# Patient Record
Sex: Female | Born: 1960 | Race: White | Hispanic: No | Marital: Married | Smoking: Never smoker
Health system: Southern US, Community
[De-identification: ages and names within clinical notes are randomized; demographics above are authoritative.]

---

## 1998-09-19 ENCOUNTER — Ambulatory Visit (HOSPITAL_COMMUNITY): Admission: RE | Admit: 1998-09-19 | Discharge: 1998-09-19 | Payer: Self-pay | Admitting: Obstetrics and Gynecology

## 2004-11-04 ENCOUNTER — Other Ambulatory Visit: Admission: RE | Admit: 2004-11-04 | Discharge: 2004-11-04 | Payer: Self-pay | Admitting: Obstetrics & Gynecology

## 2009-02-25 ENCOUNTER — Encounter: Admission: RE | Admit: 2009-02-25 | Discharge: 2009-02-25 | Payer: Self-pay | Admitting: Obstetrics & Gynecology

## 2010-03-12 ENCOUNTER — Encounter: Admission: RE | Admit: 2010-03-12 | Discharge: 2010-03-12 | Payer: Self-pay | Admitting: Family Medicine

## 2010-06-12 HISTORY — PX: BACK SURGERY: SHX140

## 2010-06-26 ENCOUNTER — Inpatient Hospital Stay (HOSPITAL_COMMUNITY): Admission: RE | Admit: 2010-06-26 | Discharge: 2010-06-28 | Payer: Self-pay | Admitting: Orthopedic Surgery

## 2011-02-27 LAB — TYPE AND SCREEN: ABO/RH(D): A POS

## 2011-02-28 LAB — CBC
MCH: 32.4 pg (ref 26.0–34.0)
RBC: 4.25 MIL/uL (ref 3.87–5.11)
WBC: 7.9 10*3/uL (ref 4.0–10.5)

## 2011-02-28 LAB — COMPREHENSIVE METABOLIC PANEL
Albumin: 4 g/dL (ref 3.5–5.2)
CO2: 24 mEq/L (ref 19–32)
Calcium: 9.1 mg/dL (ref 8.4–10.5)
Creatinine, Ser: 0.61 mg/dL (ref 0.4–1.2)
GFR calc non Af Amer: >60 mL/min (ref >60)
Glucose, Bld: 123 mg/dL — ABNORMAL HIGH (ref 70–99)
Potassium: 3.6 mEq/L (ref 3.5–5.1)
Sodium: 139 mEq/L (ref 135–145)
Total Protein: 7 g/dL (ref 6.0–8.3)

## 2011-02-28 LAB — DIFFERENTIAL
Basophils Absolute: 0 10*3/uL (ref 0.0–0.1)
Basophils Relative: 1 % (ref 0–1)
Eosinophils Absolute: 0.3 10*3/uL (ref 0.0–0.7)
Eosinophils Relative: 4 % (ref 0–5)
Lymphocytes Relative: 36 % (ref 12–46)

## 2011-02-28 LAB — URINALYSIS, ROUTINE W REFLEX MICROSCOPIC
Bilirubin Urine: NEGATIVE
Glucose, UA: NEGATIVE mg/dL
Ketones, ur: NEGATIVE mg/dL
Nitrite: NEGATIVE
Specific Gravity, Urine: 1.031 — ABNORMAL HIGH (ref 1.005–1.030)
Urobilinogen, UA: 0.2 mg/dL (ref 0.0–1.0)
pH: 5.5 (ref 5.0–8.0)

## 2011-02-28 LAB — PROTIME-INR: Prothrombin Time: 13.3 seconds (ref 11.6–15.2)

## 2011-02-28 LAB — APTT: aPTT: 26 seconds (ref 24–37)

## 2011-09-17 ENCOUNTER — Other Ambulatory Visit: Payer: Self-pay | Admitting: Orthopedic Surgery

## 2011-09-17 DIAGNOSIS — M5126 Other intervertebral disc displacement, lumbar region: Secondary | ICD-10-CM

## 2011-09-21 ENCOUNTER — Ambulatory Visit
Admission: RE | Admit: 2011-09-21 | Discharge: 2011-09-21 | Disposition: A | Discharge status: Home / Self Care | Payer: BC Managed Care – PPO | Source: Ambulatory Visit | Attending: Orthopedic Surgery | Admitting: Orthopedic Surgery

## 2011-09-21 DIAGNOSIS — M5126 Other intervertebral disc displacement, lumbar region: Secondary | ICD-10-CM

## 2011-09-21 MED ORDER — DIAZEPAM 5 MG PO TABS
10.0000 mg | ORAL_TABLET | Freq: Once | ORAL | Status: AC
  Administered 2011-09-21: 10 mg via ORAL

## 2011-09-21 MED ORDER — IOHEXOL 180 MG/ML  SOLN
15.0000 mL | Freq: Once | INTRAMUSCULAR | Status: AC | PRN
  Administered 2011-09-21: 15 mL via INTRATHECAL

## 2012-10-19 ENCOUNTER — Encounter (HOSPITAL_COMMUNITY): Payer: Self-pay

## 2012-10-23 ENCOUNTER — Encounter (HOSPITAL_COMMUNITY)
Admission: RE | Admit: 2012-10-23 | Discharge: 2012-10-23 | Disposition: A | Discharge status: Home / Self Care | Payer: BC Managed Care – PPO | Source: Ambulatory Visit | Attending: Orthopedic Surgery | Admitting: Orthopedic Surgery

## 2012-10-23 ENCOUNTER — Ambulatory Visit (HOSPITAL_COMMUNITY)
Admission: RE | Admit: 2012-10-23 | Discharge: 2012-10-23 | Disposition: A | Discharge status: Home / Self Care | Payer: BC Managed Care – PPO | Source: Ambulatory Visit | Attending: Surgical | Admitting: Surgical

## 2012-10-23 ENCOUNTER — Encounter (HOSPITAL_COMMUNITY): Payer: Self-pay

## 2012-10-23 DIAGNOSIS — Z01818 Encounter for other preprocedural examination: Secondary | ICD-10-CM | POA: Insufficient documentation

## 2012-10-23 DIAGNOSIS — Z01810 Encounter for preprocedural cardiovascular examination: Secondary | ICD-10-CM | POA: Insufficient documentation

## 2012-10-23 DIAGNOSIS — M4120 Other idiopathic scoliosis, site unspecified: Secondary | ICD-10-CM | POA: Insufficient documentation

## 2012-10-23 DIAGNOSIS — Z882 Allergy status to sulfonamides status: Secondary | ICD-10-CM | POA: Insufficient documentation

## 2012-10-23 DIAGNOSIS — Z01812 Encounter for preprocedural laboratory examination: Secondary | ICD-10-CM | POA: Insufficient documentation

## 2012-10-23 LAB — COMPREHENSIVE METABOLIC PANEL
ALT: 32 U/L (ref 0–35)
AST: 31 U/L (ref 0–37)
Albumin: 4.1 g/dL (ref 3.5–5.2)
Alkaline Phosphatase: 53 U/L (ref 39–117)
BUN: 7 mg/dL (ref 6–23)
CO2: 28 mEq/L (ref 19–32)
Calcium: 9.7 mg/dL (ref 8.4–10.5)
Chloride: 104 mEq/L (ref 96–112)
Creatinine, Ser: 0.59 mg/dL (ref 0.50–1.10)
GFR calc Af Amer: >90 mL/min (ref >90)
GFR calc non Af Amer: >90 mL/min (ref >90)
Glucose, Bld: 106 mg/dL — ABNORMAL HIGH (ref 70–99)
Potassium: 3.8 mEq/L (ref 3.5–5.1)
Sodium: 140 mEq/L (ref 135–145)
Total Bilirubin: 0.3 mg/dL (ref 0.3–1.2)
Total Protein: 7.5 g/dL (ref 6.0–8.3)

## 2012-10-23 LAB — CBC
MCH: 30.8 pg (ref 26.0–34.0)
MCHC: 34.1 g/dL (ref 30.0–36.0)
Platelets: 277 10*3/uL (ref 150–400)
RDW: 12.6 % (ref 11.5–15.5)

## 2012-10-23 LAB — URINALYSIS, ROUTINE W REFLEX MICROSCOPIC
Bilirubin Urine: NEGATIVE
Glucose, UA: NEGATIVE mg/dL
Ketones, ur: NEGATIVE mg/dL
Leukocytes, UA: NEGATIVE
Nitrite: NEGATIVE
Protein, ur: NEGATIVE mg/dL
Specific Gravity, Urine: 1.011 (ref 1.005–1.030)
Urobilinogen, UA: 0.2 mg/dL (ref 0.0–1.0)
pH: 6.5 (ref 5.0–8.0)

## 2012-10-23 LAB — APTT: aPTT: 26 seconds (ref 24–37)

## 2012-10-23 LAB — URINE MICROSCOPIC-ADD ON

## 2012-10-23 LAB — PROTIME-INR
INR: 0.91 (ref 0.00–1.49)
Prothrombin Time: 12.2 seconds (ref 11.6–15.2)

## 2012-10-23 LAB — SURGICAL PCR SCREEN: Staphylococcus aureus: NEGATIVE

## 2012-10-23 NOTE — Patient Instructions (Signed)
20      Your procedure is scheduled on:  Thursday 10/26/2012 at 0730am  Report to [facility] at 0530 AM.  Call this number if you have problems the morning of surgery: [phone]   Remember:   Do not eat food or drink liquids after midnight!  Take these medicines the morning of surgery with A SIP OF WATER: none   Do not bring valuables to the hospital.  .  Leave suitcase in the car. After surgery it may be brought to your room.  For patients admitted to the hospital, checkout time is 11:00 AM the day of              Discharge.    Special Instructions: See [facility] Preparing  For Surgery Instruction Sheet. Do not wear jewelry, lotions powders, perfumes. Women do not shave  legs or underarms for 12 hours before showers. Contacts, partial plates, or dentures may not be worn into surgery.                          Patients discharged the day of surgery will not be allowed to drive home.  If going home the same day of surgery, must have someone stay with you  first 24 hrs.at home and arrange for someone to drive you home from the Hospital.             YOUR DRIVER IS: Spouse- Loraine Leriche   Please read over the following fact sheets that you were given: MRSA INFORMATION,INCENTIVE SPIROMETRY SHEET, SLEEP APNEA SHEET                            Telford Nab.Emera Bussie,RN,BSN     [phone]

## 2012-10-25 NOTE — Anesthesia Preprocedure Evaluation (Addendum)
Anesthesia Evaluation  Patient identified by MRN, date of birth, ID band Patient awake    Reviewed: Allergy & Precautions, H&P , NPO status , Patient's Chart, lab work & pertinent test results  History of Anesthesia Complications (+) AWARENESS UNDER ANESTHESIA  Airway Mallampati: II TM Distance: >3 FB Neck ROM: Full    Dental  (+) Teeth Intact and Dental Advisory Given   Pulmonary neg pulmonary ROS,  breath sounds clear to auscultation  Pulmonary exam normal       Cardiovascular negative cardio ROS  Rhythm:Regular Rate:Normal     Neuro/Psych negative neurological ROS  negative psych ROS   GI/Hepatic negative GI ROS, Neg liver ROS,   Endo/Other  negative endocrine ROS  Renal/GU negative Renal ROS  negative genitourinary   Musculoskeletal negative musculoskeletal ROS (+)   Abdominal   Peds  Hematology negative hematology ROS (+)   Anesthesia Other Findings   Reproductive/Obstetrics negative OB ROS                          Anesthesia Physical Anesthesia Plan  ASA: I  Anesthesia Plan: General   Post-op Pain Management:    Induction: Intravenous  Airway Management Planned: Oral ETT  Additional Equipment:   Intra-op Plan:   Post-operative Plan: Extubation in OR  Informed Consent: I have reviewed the patients History and Physical, chart, labs and discussed the procedure including the risks, benefits and alternatives for the proposed anesthesia with the patient or authorized representative who has indicated his/her understanding and acceptance.   Dental advisory given  Plan Discussed with: CRNA  Anesthesia Plan Comments:         Anesthesia Quick Evaluation

## 2012-10-26 ENCOUNTER — Encounter (HOSPITAL_COMMUNITY): Payer: Self-pay | Admitting: Anesthesiology

## 2012-10-26 ENCOUNTER — Ambulatory Visit (HOSPITAL_COMMUNITY): Payer: BC Managed Care – PPO

## 2012-10-26 ENCOUNTER — Ambulatory Visit (HOSPITAL_COMMUNITY): Payer: BC Managed Care – PPO | Admitting: Anesthesiology

## 2012-10-26 ENCOUNTER — Encounter (HOSPITAL_COMMUNITY): Payer: Self-pay | Admitting: *Deleted

## 2012-10-26 ENCOUNTER — Encounter (HOSPITAL_COMMUNITY)
Admission: RE | Disposition: A | Discharge status: Home / Self Care | Payer: Self-pay | Source: Ambulatory Visit | Attending: Orthopedic Surgery

## 2012-10-26 ENCOUNTER — Ambulatory Visit (HOSPITAL_COMMUNITY)
Admission: RE | Admit: 2012-10-26 | Discharge: 2012-10-27 | DRG: 758 | Disposition: A | Discharge status: Home / Self Care | Payer: BC Managed Care – PPO | Source: Ambulatory Visit | Attending: Orthopedic Surgery | Admitting: Orthopedic Surgery

## 2012-10-26 DIAGNOSIS — Z9889 Other specified postprocedural states: Secondary | ICD-10-CM

## 2012-10-26 DIAGNOSIS — M216X9 Other acquired deformities of unspecified foot: Secondary | ICD-10-CM | POA: Diagnosis present

## 2012-10-26 DIAGNOSIS — M48062 Spinal stenosis, lumbar region with neurogenic claudication: Secondary | ICD-10-CM | POA: Diagnosis present

## 2012-10-26 HISTORY — PX: LUMBAR LAMINECTOMY/DECOMPRESSION MICRODISCECTOMY: SHX5026

## 2012-10-26 LAB — HEMOGLOBIN AND HEMATOCRIT, BLOOD: HCT: 38.4 % (ref 36.0–46.0)

## 2012-10-26 SURGERY — LUMBAR LAMINECTOMY/DECOMPRESSION MICRODISCECTOMY
Anesthesia: General | Laterality: Right | Wound class: Clean

## 2012-10-26 MED ORDER — HYDROMORPHONE HCL PF 1 MG/ML IJ SOLN
INTRAMUSCULAR | Status: AC
  Filled 2012-10-26: qty 1

## 2012-10-26 MED ORDER — HYDROCODONE-ACETAMINOPHEN 5-325 MG PO TABS
1.0000 | ORAL_TABLET | ORAL | Status: DC | PRN
  Administered 2012-10-27: 1 via ORAL
  Filled 2012-10-26 (×2): qty 1

## 2012-10-26 MED ORDER — PROPOFOL 10 MG/ML IV BOLUS
INTRAVENOUS | Status: DC | PRN
  Administered 2012-10-26: 150 mg via INTRAVENOUS

## 2012-10-26 MED ORDER — FLEET ENEMA 7-19 GM/118ML RE ENEM: 1.0000 | ENEMA | Freq: Once | RECTAL | Status: AC | PRN

## 2012-10-26 MED ORDER — PROMETHAZINE HCL 25 MG/ML IJ SOLN: 6.2500 mg | INTRAMUSCULAR | Status: DC | PRN

## 2012-10-26 MED ORDER — HYDROMORPHONE HCL PF 1 MG/ML IJ SOLN: 0.5000 mg | INTRAMUSCULAR | Status: DC | PRN

## 2012-10-26 MED ORDER — DEXTROSE 5 % IV SOLN
500.0000 mg | Freq: Four times a day (QID) | INTRAVENOUS | Status: DC | PRN
  Administered 2012-10-26: 500 mg via INTRAVENOUS
  Filled 2012-10-26: qty 5

## 2012-10-26 MED ORDER — POLYETHYLENE GLYCOL 3350 17 G PO PACK: 17.0000 g | PACK | Freq: Every day | ORAL | Status: DC | PRN

## 2012-10-26 MED ORDER — THROMBIN 5000 UNITS EX SOLR
CUTANEOUS | Status: AC
  Filled 2012-10-26: qty 10000

## 2012-10-26 MED ORDER — LACTATED RINGERS IV SOLN
INTRAVENOUS | Status: DC
  Administered 2012-10-26: 19:00:00 via INTRAVENOUS

## 2012-10-26 MED ORDER — EPHEDRINE SULFATE 50 MG/ML IJ SOLN
INTRAMUSCULAR | Status: DC | PRN
  Administered 2012-10-26: 10 mg via INTRAVENOUS

## 2012-10-26 MED ORDER — MENTHOL 3 MG MT LOZG
1.0000 | LOZENGE | OROMUCOSAL | Status: DC | PRN
  Filled 2012-10-26: qty 9

## 2012-10-26 MED ORDER — CEFAZOLIN SODIUM-DEXTROSE 2-3 GM-% IV SOLR
2.0000 g | INTRAVENOUS | Status: AC
  Administered 2012-10-26: 2 g via INTRAVENOUS

## 2012-10-26 MED ORDER — HEMOSTATIC AGENTS (NO CHARGE) OPTIME
TOPICAL | Status: DC | PRN
  Administered 2012-10-26: 1 via TOPICAL

## 2012-10-26 MED ORDER — MIDAZOLAM HCL 5 MG/5ML IJ SOLN
INTRAMUSCULAR | Status: DC | PRN
  Administered 2012-10-26: 2 mg via INTRAVENOUS

## 2012-10-26 MED ORDER — HYDROMORPHONE HCL PF 1 MG/ML IJ SOLN
0.2500 mg | INTRAMUSCULAR | Status: DC | PRN
  Administered 2012-10-26 (×2): 0.5 mg via INTRAVENOUS

## 2012-10-26 MED ORDER — CEFAZOLIN SODIUM 1-5 GM-% IV SOLN
1.0000 g | Freq: Three times a day (TID) | INTRAVENOUS | Status: AC
  Administered 2012-10-26 – 2012-10-27 (×3): 1 g via INTRAVENOUS
  Filled 2012-10-26 (×4): qty 50

## 2012-10-26 MED ORDER — SODIUM CHLORIDE 0.9 % IR SOLN
Status: DC | PRN
  Administered 2012-10-26: 08:00:00

## 2012-10-26 MED ORDER — LACTATED RINGERS IV SOLN: INTRAVENOUS | Status: DC

## 2012-10-26 MED ORDER — BISACODYL 10 MG RE SUPP: 10.0000 mg | Freq: Every day | RECTAL | Status: DC | PRN

## 2012-10-26 MED ORDER — ONDANSETRON HCL 4 MG/2ML IJ SOLN
INTRAMUSCULAR | Status: DC | PRN
  Administered 2012-10-26: 4 mg via INTRAVENOUS

## 2012-10-26 MED ORDER — BUPIVACAINE LIPOSOME 1.3 % IJ SUSP
20.0000 mL | Freq: Once | INTRAMUSCULAR | Status: AC
  Administered 2012-10-26: 20 mL
  Filled 2012-10-26: qty 20

## 2012-10-26 MED ORDER — CEFAZOLIN SODIUM-DEXTROSE 2-3 GM-% IV SOLR
INTRAVENOUS | Status: AC
  Filled 2012-10-26: qty 50

## 2012-10-26 MED ORDER — ONDANSETRON HCL 4 MG/2ML IJ SOLN: 4.0000 mg | INTRAMUSCULAR | Status: DC | PRN

## 2012-10-26 MED ORDER — LACTATED RINGERS IV SOLN
INTRAVENOUS | Status: DC
  Administered 2012-10-26 (×3): via INTRAVENOUS

## 2012-10-26 MED ORDER — PHENOL 1.4 % MT LIQD
1.0000 | OROMUCOSAL | Status: DC | PRN
  Filled 2012-10-26: qty 177

## 2012-10-26 MED ORDER — DIPHENHYDRAMINE HCL 25 MG PO CAPS
25.0000 mg | ORAL_CAPSULE | Freq: Four times a day (QID) | ORAL | Status: DC | PRN
  Administered 2012-10-26 (×2): 25 mg via ORAL
  Filled 2012-10-26 (×2): qty 1

## 2012-10-26 MED ORDER — BACITRACIN-NEOMYCIN-POLYMYXIN 400-5-5000 EX OINT
TOPICAL_OINTMENT | CUTANEOUS | Status: AC
  Filled 2012-10-26: qty 1

## 2012-10-26 MED ORDER — ACETAMINOPHEN 650 MG RE SUPP: 650.0000 mg | RECTAL | Status: DC | PRN

## 2012-10-26 MED ORDER — OXYCODONE-ACETAMINOPHEN 5-325 MG PO TABS: 1.0000 | ORAL_TABLET | ORAL | Status: DC | PRN

## 2012-10-26 MED ORDER — ACETAMINOPHEN 10 MG/ML IV SOLN
INTRAVENOUS | Status: AC
  Filled 2012-10-26: qty 100

## 2012-10-26 MED ORDER — METHOCARBAMOL 500 MG PO TABS
500.0000 mg | ORAL_TABLET | Freq: Four times a day (QID) | ORAL | Status: DC | PRN
  Administered 2012-10-26: 500 mg via ORAL
  Filled 2012-10-26: qty 1

## 2012-10-26 MED ORDER — CISATRACURIUM BESYLATE (PF) 10 MG/5ML IV SOLN
INTRAVENOUS | Status: DC | PRN
  Administered 2012-10-26: 10 mg via INTRAVENOUS

## 2012-10-26 MED ORDER — FENTANYL CITRATE 0.05 MG/ML IJ SOLN
INTRAMUSCULAR | Status: DC | PRN
  Administered 2012-10-26: 100 ug via INTRAVENOUS

## 2012-10-26 MED ORDER — ACETAMINOPHEN 325 MG PO TABS: 650.0000 mg | ORAL_TABLET | ORAL | Status: DC | PRN

## 2012-10-26 MED ORDER — ACETAMINOPHEN 10 MG/ML IV SOLN
INTRAVENOUS | Status: DC | PRN
  Administered 2012-10-26: 1000 mg via INTRAVENOUS

## 2012-10-26 MED ORDER — DEXAMETHASONE SODIUM PHOSPHATE 10 MG/ML IJ SOLN
INTRAMUSCULAR | Status: DC | PRN
  Administered 2012-10-26: 10 mg via INTRAVENOUS

## 2012-10-26 MED ORDER — THROMBIN 5000 UNITS EX SOLR
CUTANEOUS | Status: DC | PRN
  Administered 2012-10-26: 10000 [IU] via TOPICAL

## 2012-10-26 SURGICAL SUPPLY — 44 items
BAG ZIPLOCK 12X15 (MISCELLANEOUS) ×2 IMPLANT
BENZOIN TINCTURE PRP APPL 2/3 (GAUZE/BANDAGES/DRESSINGS) ×2 IMPLANT
CLEANER TIP ELECTROSURG 2X2 (MISCELLANEOUS) ×2 IMPLANT
CLOTH BEACON ORANGE TIMEOUT ST (SAFETY) ×2 IMPLANT
CONT SPECI 4OZ STER CLIK (MISCELLANEOUS) IMPLANT
DRAIN PENROSE 18X1/4 LTX STRL (WOUND CARE) IMPLANT
DRAPE MICROSCOPE LEICA (MISCELLANEOUS) ×2 IMPLANT
DRAPE POUCH INSTRU U-SHP 10X18 (DRAPES) ×2 IMPLANT
DRAPE SURG 17X11 SM STRL (DRAPES) ×2 IMPLANT
DRSG ADAPTIC 3X8 NADH LF (GAUZE/BANDAGES/DRESSINGS) ×2 IMPLANT
DRSG PAD ABDOMINAL 8X10 ST (GAUZE/BANDAGES/DRESSINGS) ×2 IMPLANT
DURAPREP 26ML APPLICATOR (WOUND CARE) ×2 IMPLANT
ELECT BLADE TIP CTD 4 INCH (ELECTRODE) ×2 IMPLANT
ELECT REM PT RETURN 9FT ADLT (ELECTROSURGICAL) ×2
ELECTRODE REM PT RTRN 9FT ADLT (ELECTROSURGICAL) ×1 IMPLANT
GLOVE BIOGEL PI IND STRL 8 (GLOVE) ×2 IMPLANT
GLOVE BIOGEL PI INDICATOR 8 (GLOVE) ×2
GLOVE ECLIPSE 8.0 STRL XLNG CF (GLOVE) ×4 IMPLANT
GOWN PREVENTION PLUS LG XLONG (DISPOSABLE) ×4 IMPLANT
GOWN STRL REIN XL XLG (GOWN DISPOSABLE) ×4 IMPLANT
KIT BASIN OR (CUSTOM PROCEDURE TRAY) ×2 IMPLANT
KIT POSITIONING SURG ANDREWS (MISCELLANEOUS) ×2 IMPLANT
MANIFOLD NEPTUNE II (INSTRUMENTS) ×2 IMPLANT
NEEDLE SPNL 18GX3.5 QUINCKE PK (NEEDLE) ×4 IMPLANT
NS IRRIG 1000ML POUR BTL (IV SOLUTION) ×2 IMPLANT
PATTIES SURGICAL .5 X.5 (GAUZE/BANDAGES/DRESSINGS) IMPLANT
PATTIES SURGICAL .75X.75 (GAUZE/BANDAGES/DRESSINGS) IMPLANT
PATTIES SURGICAL 1X1 (DISPOSABLE) IMPLANT
PIN SAFETY NICK PLATE  2 MED (MISCELLANEOUS)
PIN SAFETY NICK PLATE 2 MED (MISCELLANEOUS) IMPLANT
POSITIONER SURGICAL ARM (MISCELLANEOUS) ×2 IMPLANT
SPONGE GAUZE 4X4 12PLY (GAUZE/BANDAGES/DRESSINGS) ×2 IMPLANT
SPONGE LAP 18X18 X RAY DECT (DISPOSABLE) ×2 IMPLANT
SPONGE LAP 4X18 X RAY DECT (DISPOSABLE) ×2 IMPLANT
SPONGE SURGIFOAM ABS GEL 100 (HEMOSTASIS) ×2 IMPLANT
STAPLER VISISTAT 35W (STAPLE) IMPLANT
SUT VIC AB 0 CT1 27 (SUTURE) ×1
SUT VIC AB 0 CT1 27XBRD ANTBC (SUTURE) ×1 IMPLANT
SUT VIC AB 1 CT1 27 (SUTURE) ×4
SUT VIC AB 1 CT1 27XBRD ANTBC (SUTURE) ×4 IMPLANT
TAPE CLOTH SURG 6X10 WHT LF (GAUZE/BANDAGES/DRESSINGS) ×2 IMPLANT
TOWEL OR 17X26 10 PK STRL BLUE (TOWEL DISPOSABLE) ×4 IMPLANT
TRAY LAMINECTOMY (CUSTOM PROCEDURE TRAY) ×2 IMPLANT
WATER STERILE IRR 1500ML POUR (IV SOLUTION) ×2 IMPLANT

## 2012-10-26 NOTE — Transfer of Care (Signed)
Immediate Anesthesia Transfer of Care Note  Patient: AMBERA FEDELE  Procedure(s) Performed: Procedure(s) (LRB) with comments: LUMBAR LAMINECTOMY/DECOMPRESSION MICRODISCECTOMY (Right) - Hemi Laminectomy, Microdiscectomy  L4-L5 on Right. Decompression for spinal stenosis L3-L4, decompression for recurrent L4-L5, Foramenectomy L3 root, L4 root, L5 root  Patient Location: PACU  Anesthesia Type:General  Level of Consciousness: awake, alert , oriented and patient cooperative  Airway & Oxygen Therapy: Patient Spontanous Breathing and Patient connected to face mask oxygen  Post-op Assessment: Report given to PACU RN and Post -op Vital signs reviewed and stable  Post vital signs: Reviewed and stable  Complications: No apparent anesthesia complications

## 2012-10-26 NOTE — Anesthesia Postprocedure Evaluation (Signed)
Anesthesia Post Note  Patient: Shelby [name]  Procedure(s) Performed: Procedure(s) (LRB): LUMBAR LAMINECTOMY/DECOMPRESSION MICRODISCECTOMY (Right)  Anesthesia type: General  Patient location: PACU  Post pain: Pain level controlled  Post assessment: Post-op Vital signs reviewed  Last Vitals:  Filed Vitals:   10/26/12 1300  BP: 109/73  Pulse: 110  Temp: 36.6 C  Resp: 16    Post vital signs: Reviewed  Level of consciousness: sedated  Complications: No apparent anesthesia complications

## 2012-10-26 NOTE — Op Note (Signed)
[name], [name] NO.:  [id number]  MEDICAL RECORD NO.:  [id number]  LOCATION:  1601                         FACILITY:  [facility]  PHYSICIAN:  Shelby [name]. Shelby [name], M.D.DATE OF BIRTH:  September 01, 1961  DATE OF PROCEDURE:  10/26/2012 DATE OF DISCHARGE:                              OPERATIVE REPORT   SURGEON:  Shelby [name]. Shelby Hillock, MD  ASSISTANT:  Shelby Every, MD  PREOPERATIVE DIAGNOSES: 1. 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 2. Spinal stenosis at L3-4.  Note, she had previous surgery at L4-5     two years ago. 3. She also had a partial footdrop.  POSTOPERATIVE DIAGNOSES: 1. Foraminal stenosis at the L3 root on the right. 2. Foraminal stenosis at L4 root on the right. 3. Foraminal stenosis at the L5 root on the right. 4. Severe recurrent spinal stenosis at L4-5. 5. Spinal stenosis at L3-4 and these levels of stenosis were due to     retrolisthesis and collapse of the disk spaces. 6. He also had a partial footdrop.  OPERATION: 1. Decompression of the L3 root on the right. 2. Decompression of the L4 root on the right. 3. Decompression of the L5 root on the right. 4. Complete decompressive lumbar laminectomy at L4-5 for recurrent     stenosis. 5.  Decompressive lumbar laminectomy at L3-4 for spinal     stenosis.  PROCEDURE:  Under general anesthesia, routine orthopedic prep and draping of the back was carried out with the patient on spinal frame. The appropriate time-out was carried out in the operating room.  I also marked the appropriate right side of her back because that was the right leg was remained area of pain.  She had a partial footdrop on the right. After 2 needles were placed in the back, the sterile prepping and draping of 2 needles were placed in the back for localization purposes. X-ray was taken.  Incision was made over the L3-4, L4-5 spaces.  I went down, identified the spinous process of L3.  I stripped the muscle from spinous process  bilaterally.  I went down and removed a large amount of scar at the central portion of the canal.  Another x-ray was taken to verify position.  Following that, I removed a portion of spinous process of L3 and I began a lumbar laminectomy, curved it around on the right side and all the way down to right lateral gutter.  I decompressed first the L3 root and the L4 root and the L5 root.  We then did a decompression at L4-5 for recurrent spinal stenosis, which was due to retrolisthesis and collapse of the disk space.  We also went up proximally and decompressed the L3-4 canal for spinal stenosis.  The microscope was used.  We brought the microscope in during the entire procedure.  I did go down distally at 4-5 and removed the facet on the right.  It was severely overgrown and thickened.  She had synovial cyst there as well.  Once we established the roots and that were clear, we thoroughly irrigated out the area, checked the foramina with the hockey- stick and made sure the foramina were open.  I  thoroughly irrigated out the area and then loosely closed the deep part of the wound with #1 Vicryl.  I left a small deep distal and proximal part of the wound open for drainage purposes to avoid any compression.  Subcu was closed with #1 Vicryl, skin with metal staples. I did inject 20 mL of Exparel into the soft tissue prior to closing the skin.  Sterile Neosporin dressings were applied.          ______________________________ Shelby [name] Shelby [name], M.D.     RAG/MEDQ  D:  10/26/2012  T:  10/26/2012  Job:  [id number]

## 2012-10-26 NOTE — Interval H&P Note (Signed)
History and Physical Interval Note:  10/26/2012 7:24 AM  Shelby [name]  has presented today for surgery, with the diagnosis of recurring herniated disc L4-L5 on right  The various methods of treatment have been discussed with the patient and family. After consideration of risks, benefits and other options for treatment, the patient has consented to  Procedure(s) (LRB) with comments: LUMBAR LAMINECTOMY/DECOMPRESSION MICRODISCECTOMY (Right) - Hemi Laminectomy, Microdiscectomy  L4-L5 on Right as a surgical intervention .  The patient's history has been reviewed, patient examined, no change in status, stable for surgery.  I have reviewed the patient's chart and labs.  Questions were answered to the patient's satisfaction.     Nazanin Kinner A

## 2012-10-26 NOTE — Brief Op Note (Signed)
10/26/2012  9:33 AM  PATIENT:  Shelby [name]  51 y.o. female  PRE-OPERATIVE DIAGNOSIS:  recurring herniated disc L4-L5 on right  POST-OPERATIVE DIAGNOSIS:  recurring  L4-L5 on right  PROCEDURE:  Procedure(s) (LRB) with comments: LUMBAR LAMINECTOMY/DECOMPRESSION MICRODISCECTOMY (Right) - Hemi Laminectomy, Microdiscectomy  L4-L5 on Right. Decompression for spinal stenosis L3-L4, decompression for recurrent L4-L5, Foramenectomy L3 root, L4 root, L5 root  SURGEON:  Surgeon(s) and Role:    * Jacki Cones, MD - Primary    * Javier Docker, MD - Assisting  :   ASSISTANTS:Jeff Beane MD  ANESTHESIA:   general  EBL:  Total I/O In: 1000 [I.V.:1000] Out: 100 [Blood:100]  BLOOD ADMINISTERED:none  DRAINS: none   LOCAL MEDICATIONS USED:  BUPIVICAINE 20cc  SPECIMEN:  No Specimen  DISPOSITION OF SPECIMEN:  N/A  COUNTS:  YES  TOURNIQUET:  * No tourniquets in log *  DICTATION: .Other Dictation: Dictation Number [id number]  PLAN OF CARE: Admit to inpatient   PATIENT DISPOSITION:  Stable in OR   Delay start of Pharmacological VTE agent (>24hrs) due to surgical blood loss or risk of bleeding: yes

## 2012-10-26 NOTE — Progress Notes (Signed)
PT Cancellation Note  Patient Details Name: Shelby [name] MRN: [id number] DOB: 1961/10/07   Cancelled Treatment:    Reason Eval/Treat Not Completed:  (orders for POD 1)  Pt with OOB and ambulate orders for POD 1 and RN reports pt on bedrest today and MD stated for pt not to get OOB today.   [name],KATHrine E 10/26/2012, 2:40 PM Pager: [phone]

## 2012-10-26 NOTE — H&P (Signed)
Shelby [name] is an 51 y.o. female.   Chief Complaint: Pain in Right Leg. HPI: Prior Microdiscectomy on Right,two years ago.  History reviewed. No pertinent past medical history.  Past Surgical History  Procedure Date  . Back surgery 06/2010    History reviewed. No pertinent family history. Social History:  does not have a smoking history on file. She does not have any smokeless tobacco history on file. She reports that she drinks alcohol. She reports that she does not use illicit drugs.  Allergies:  Allergies  Allergen Reactions  . Sulfa Antibiotics Other (See Comments)    Red all over    Medications Prior to Admission  Medication Sig Dispense Refill  . ibuprofen (ADVIL,MOTRIN) 200 MG tablet Take 200 mg by mouth every 6 (six) hours as needed.      . Multiple Vitamins-Minerals (MULTIVITAMIN WITH MINERALS) tablet Take 1 tablet by mouth daily.      . Nutritional Supplements (ESTROVEN PO) Take 1 tablet by mouth every morning.        No results found for this or any previous visit (from the past 48 hour(s)). No results found.  Review of Systems  Constitutional: Negative.   HENT: Negative.   Eyes: Negative.   Cardiovascular: Negative.   Gastrointestinal: Negative.   Genitourinary: Negative.   Musculoskeletal: Positive for back pain.  Skin: Negative.   Neurological: Negative.   Endo/Heme/Allergies: Negative.   Psychiatric/Behavioral: Negative.     Blood pressure 135/83, pulse 100, temperature 98.2 F (36.8 C), resp. rate 20, SpO2 99.00%. Physical Exam  Constitutional: She appears well-developed.  Eyes: Pupils are equal, round, and reactive to light.  Neck: Normal range of motion.  Cardiovascular: Normal rate.   Respiratory: Effort normal.  GI: Soft.  Musculoskeletal:       Right ankle: Achilles tendon normal.       Feet:  Neurological: She is alert.  Skin: Skin is warm.     Assessment/Plan Microdiscectomy l-4-L-5 on Right for recurrent HNP.  Tristan Bramble  A 10/26/2012, 7:17 AM

## 2012-10-27 ENCOUNTER — Encounter (HOSPITAL_COMMUNITY): Payer: Self-pay | Admitting: Orthopedic Surgery

## 2012-10-27 MED ORDER — METHOCARBAMOL 500 MG PO TABS: 500.0000 mg | ORAL_TABLET | Freq: Four times a day (QID) | ORAL | Status: DC | PRN

## 2012-10-27 MED ORDER — METHOCARBAMOL 500 MG PO TABS: 500.0000 mg | ORAL_TABLET | Freq: Four times a day (QID) | ORAL | Status: AC | PRN

## 2012-10-27 MED ORDER — OXYCODONE-ACETAMINOPHEN 5-325 MG PO TABS: 1.0000 | ORAL_TABLET | ORAL | Status: AC | PRN

## 2012-10-27 MED ORDER — OXYCODONE-ACETAMINOPHEN 5-325 MG PO TABS: 1.0000 | ORAL_TABLET | ORAL | Status: DC | PRN

## 2012-10-27 NOTE — Progress Notes (Signed)
   Subjective: 1 Day Post-Op Procedure(s) (LRB): LUMBAR LAMINECTOMY/DECOMPRESSION MICRODISCECTOMY (Right) Patient reports pain as mild.   Patient seen in rounds with Dr.Gioffre. Patient is well, and has had no acute complaints or problems. She reports that she had some soreness in her back but her legs feel "great". She is voiding well on her own. No issues overnight. She denies chest pain and shortness of breath.  Plan is to go Home after hospital stay.  Objective: Vital signs in last 24 hours: Temp:  [97.6 F (36.4 C)-99.9 F (37.7 C)] 97.6 F (36.4 C) (11/15 0535) Pulse Rate:  [83-114] 102  (11/15 0535) Resp:  [9-20] 20  (11/15 0535) BP: (77-158)/(46-89) 130/84 mmHg (11/15 0535) SpO2:  [93 %-100 %] 98 % (11/15 0535) Weight:  [82.555 kg (182 lb)] 82.555 kg (182 lb) (11/14 1228)  Intake/Output from previous day:  Intake/Output Summary (Last 24 hours) at 10/27/12 0705 Last data filed at 10/27/12 0538  Gross per 24 hour  Intake 5188.66 ml  Output   3310 ml  Net 1878.66 ml     Labs:  Basename 10/26/12 1116  HGB 13.3    Basename 10/26/12 1116  WBC --  RBC --  HCT 38.4  PLT --    EXAM General - Patient is Alert and Oriented Extremity - Neurologically intact Neurovascular intact Dorsiflexion/Plantar flexion intact Dressing/Incision - clean, dry, no drainage Motor Function - intact, moving foot and toes well on exam.   History reviewed. No pertinent past medical history.  Assessment/Plan: 1 Day Post-Op Procedure(s) (LRB): LUMBAR LAMINECTOMY/DECOMPRESSION MICRODISCECTOMY (Right) Active Problems:  Neurogenic claudication due to lumbar spinal stenosis  Estimated Body mass index is 31.24 kg/(m^2) as calculated from the following:   Height as of this encounter: 5\' 4" (1.626 m).   Weight as of this encounter: 182 lb(82.555 kg).  Advance diet Up with therapy D/C IV fluids Discharge home  Weight-Bearing as tolerated   She is doing very well. She is going home  today after walking with therapy. We will see her back in the office in 2 weeks for check. She was instructed about post op wound and dressing care.   Shelby [name] Shelby [name] 10/27/2012, 7:05 AM

## 2012-10-27 NOTE — Evaluation (Signed)
Occupational Therapy Evaluation Patient Details Name: Shelby [name] MRN: [id number] DOB: 1960-12-29 Today's Date: 10/27/2012 Time:  808 to 8:25  17 minutes-     OT Assessment / Plan / Recommendation Clinical Impression  this 51 year old female was admitted for decompression.  She had a previous back surgery in 7/11.  Reviewed precautions and adls and pt verbalizes all.  She does not need further OT nor dme.      OT Assessment       Follow Up Recommendations  No OT follow up    Barriers to Discharge      Equipment Recommendations  None recommended by OT    Recommendations for Other Services    Frequency       Precautions / Restrictions Precautions Precautions: Back Restrictions Weight Bearing Restrictions: No   Pertinent Vitals/Pain No pain    ADL  Tub/Shower Transfer: Performed;Independent Tub/Shower Transfer Method: Ambulating Transfers/Ambulation Related to ADLs: ambulated to bathroom independently.  Pt will be able to use standard commode at home:  educated on sitting backwards if needed.  ADL Comments: Reviewed adls and back precautions as well as modifications.  Pt can cross legs but cannot comfortably reach feet (min A for ADLs).  Pt verbalizes all education.      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         Vision/Perception     Cognition  Overall Cognitive  Status: Appears within functional limits for tasks assessed/performed Behavior During Session: [facility] for tasks performed    Extremity/Trunk Assessment Right Upper Extremity Assessment RUE ROM/Strength/Tone: [facility] for tasks assessed Left Upper Extremity Assessment LUE ROM/Strength/Tone: WFL for tasks assessed     Mobility Bed Mobility Bed Mobility: Rolling Right;Right Sidelying to Sit Rolling Right: 7: Independent Right Sidelying to Sit: 6: Modified independent (Device/Increase time)     Shoulder Instructions     Exercise     Balance     End of Session OT - End of Session Activity Tolerance: Patient tolerated treatment well Patient left: in chair;with call bell/phone within reach  GO     Cornelius Schuitema 10/27/2012, 8:50 AM Marica Otter, OTR/L [phone] 10/27/2012

## 2012-10-27 NOTE — Progress Notes (Signed)
Pt to d/c home. AVS reviewed and "My Chart" discussed with pt. Pt capable of verbalizing medications and follow-up appointments. Remains hemodynamically stable. No signs and symptoms of distress. Educated pt to return to ER in the case of SOB, dizziness, or chest pain.  

## 2012-10-27 NOTE — Evaluation (Signed)
Physical Therapy Evaluation Patient Details Name: Shelby [name] MRN: [id number] DOB: 1960/12/16 Today's Date: 10/27/2012 Time: 7829-5621 PT Time Calculation (min): 10 min  PT Assessment / Plan / Recommendation Clinical Impression  Pt s/p decompression for lumbar spinal stenosis.  Pt able to recall a couple precautions from OT visit this morning, however reviewed all precautions.  Pt doing well with ambulation without assistive device and also performed stairs without trouble.  Pt plans to d/c home today and agrees to not require any PT services at this time.    PT Assessment  Patent does not need any further PT services    Follow Up Recommendations  No PT follow up    Does the patient have the potential to tolerate intense rehabilitation      Barriers to Discharge        Equipment Recommendations  None recommended by PT    Recommendations for Other Services     Frequency      Precautions / Restrictions Precautions Precautions: Back Precaution Comments: pt provided with handout by OT Restrictions Weight Bearing Restrictions: No   Pertinent Vitals/Pain Pt reports premedicated with no pain.      Mobility  Bed Mobility Bed Mobility: Rolling Right;Right Sidelying to Sit Rolling Right: 7: Independent Right Sidelying to Sit: 6: Modified independent (Device/Increase time) Details for Bed Mobility Assistance: pt reports log roll technique reviewed with OT Transfers Transfers: Stand to Sit;Sit to Stand Sit to Stand: 5: Supervision;With upper extremity assist;From chair/3-in-1 Stand to Sit: 5: Supervision;With upper extremity assist;To chair/3-in-1 Ambulation/Gait Ambulation/Gait Assistance: 5: Supervision Ambulation Distance (Feet): 400 Feet Assistive device: None Ambulation/Gait Assistance Details: pt does well with ambulation without assistive device, turns whole body with no twisting without cues, pt reports R heel pain previous to surgery and now no longer present, denies  numbness and tingling Gait Pattern: Step-through pattern Stairs: Yes Stairs Assistance: 5: Supervision Stairs Assistance Details (indicate cue type and reason): cued for sequence with step to pattern however pt able to comfortably perform step through pattern Stair Management Technique: One rail Right;Forwards;Alternating pattern Number of Stairs: 10     Shoulder Instructions     Exercises     PT Diagnosis:    PT Problem List:   PT Treatment Interventions:     PT Goals    Visit Information  Last PT Received On: 10/27/12 Assistance Needed: +1    Subjective Data  Subjective: I heard you have to have a doctorate for physical therapy now.   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    Cognition  Overall Cognitive Status: Appears within functional limits for tasks assessed/performed Arousal/Alertness: Awake/alert Orientation Level: Appears intact for tasks assessed Behavior During Session: [facility] for tasks performed    Extremity/Trunk Assessment Right Upper Extremity Assessment RUE ROM/Strength/Tone: [facility] for tasks assessed Left Upper Extremity Assessment LUE ROM/Strength/Tone: [facility] for tasks assessed Right Lower Extremity Assessment RLE ROM/Strength/Tone: [facility] for tasks assessed Left Lower Extremity Assessment LLE ROM/Strength/Tone: WFL for tasks assessed   Balance    End of Session PT - End of Session Activity Tolerance: Patient tolerated treatment well Patient  left: in chair;with  call bell/phone within reach;with family/visitor present  GP     Shelby [name],Shelby [name] 10/27/2012, 9:25 AM Pager: [phone]

## 2012-10-30 NOTE — Discharge Summary (Signed)
Physician Discharge Summary   Patient ID: Shelby Shelby [name] MRN: [id number] DOB/AGE: 53-Aug-1962 53 y.o.  Admit date: 10/26/2012 Discharge date: 10/27/2012  Primary Diagnosis:  Lumbar spinal stenosis Lumbar disc herniation  Admission Diagnoses:  History reviewed. No pertinent past medical history. Discharge Diagnoses:   Active Problems:  Neurogenic claudication due to lumbar spinal stenosis  Estimated Body mass index is 31.24 kg/(m^2) as calculated from the following:   Height as of this encounter: 5\' 4" (1.626 m).   Weight as of this encounter: 182 lb(82.555 kg).  Classification of overweight in adults according to BMI (WHO, 1998)   Procedure:  Procedure(s) (LRB): LUMBAR LAMINECTOMY/DECOMPRESSION MICRODISCECTOMY (Right)   Consults: None  HPI: Shelby Shelby [name] is a 51 year old female who has a history of a microdiscectomy two years ago. She had recurrent pain in the back with radicular pain into the right leg the last several months with no new injury. No bladder or bowel issues. MRI revealed recurrent disc at L4-L5.  Laboratory Data: Admission on 10/26/2012, Discharged on 10/27/2012  Component Date Value Range Status  . Hemoglobin 10/26/2012 13.3  12.0 - 15.0 g/dL Final  . HCT 56/21/3086 38.4  36.0 - 46.0 % Final  Hospital Outpatient Visit on 10/23/2012  Component Date Value Range Status  . MRSA, PCR 10/23/2012 NEGATIVE  NEGATIVE Final  . Staphylococcus aureus 10/23/2012 NEGATIVE  NEGATIVE Final   Comment:                                 The Xpert SA Assay (FDA                          approved for NASAL specimens                          in patients over 35 years of age),                          is one component of                          a comprehensive surveillance                          program.  Test performance has                          been validated by [employer] for patients greater                          than or equal to 85 year old.                          It is not intended                          to diagnose infection nor to                          guide or  monitor treatment.  Marland Kitchen aPTT 10/23/2012 26  24 - 37 seconds Final  . Sodium 10/23/2012 140  135 - 145 mEq/L Final  . Potassium 10/23/2012 3.8  3.5 - 5.1 mEq/L Final  . Chloride 10/23/2012 104  96 - 112 mEq/L Final  . CO2 10/23/2012 28  19 - 32 mEq/L Final  . Glucose, Bld 10/23/2012 106* 70 - 99 mg/dL Final  . BUN 16/09/9603 7  6 - 23 mg/dL Final  . Creatinine, Shelby [name] 10/23/2012 0.59  0.50 - 1.10 mg/dL Final  . Calcium 54/08/8118 9.7  8.4 - 10.5 mg/dL Final  . Total Protein 10/23/2012 7.5  6.0 - 8.3 g/dL Final  . Albumin 14/78/2956 4.1  3.5 - 5.2 g/dL Final  . AST 21/30/8657 31  0 - 37 U/L Final  . ALT 10/23/2012 32  0 - 35 U/L Final  . Alkaline Phosphatase 10/23/2012 53  39 - 117 U/L Final  . Total Bilirubin 10/23/2012 0.3  0.3 - 1.2 mg/dL Final  . GFR calc non Af Amer 10/23/2012 >90  >90 mL/min Final  . GFR calc Af Amer 10/23/2012 >90  >90 mL/min Final   Comment:                                 The eGFR has been calculated                          using the CKD EPI equation.                          This calculation has not been                          validated in all clinical                          situations.                          eGFR's persistently                          <90 mL/min signify                          possible Chronic Kidney Disease.  Marland Kitchen Prothrombin Time 10/23/2012 12.2  11.6 - 15.2 seconds Final  . INR 10/23/2012 0.91  0.00 - 1.49 Final  . Color, Urine 10/23/2012 YELLOW  YELLOW Final  . APPearance 10/23/2012 CLOUDY* CLEAR Final  . Specific Gravity, Urine 10/23/2012 1.011  1.005 - 1.030 Final  . pH 10/23/2012 6.5  5.0 - 8.0 Final  . Glucose, UA 10/23/2012 NEGATIVE  NEGATIVE mg/dL Final  . Hgb urine dipstick 10/23/2012 TRACE* NEGATIVE Final  . Bilirubin Urine 10/23/2012 NEGATIVE  NEGATIVE Final  . Ketones, ur 10/23/2012 NEGATIVE   NEGATIVE mg/dL Final  . Protein, ur 84/69/6295 NEGATIVE  NEGATIVE mg/dL Final  . Urobilinogen, UA 10/23/2012 0.2  0.0 - 1.0 mg/dL Final  . Nitrite 28/41/3244 NEGATIVE  NEGATIVE Final  . Leukocytes, UA 10/23/2012 NEGATIVE  NEGATIVE Final  . WBC 10/23/2012 6.9  4.0 - 10.5 K/uL Final  . RBC 10/23/2012 4.65  3.87 - 5.11 MIL/uL Final  . Hemoglobin 10/23/2012 14.3  12.0 - 15.0 g/dL Final  . HCT 16/09/9603 41.9  36.0 - 46.0 % Final  . MCV 10/23/2012 90.1  78.0 - 100.0 fL Final  . MCH 10/23/2012 30.8  26.0 - 34.0 pg Final  . MCHC 10/23/2012 34.1  30.0 - 36.0 g/dL Final  . RDW 54/08/8118 12.6  11.5 - 15.5 % Final  . Platelets 10/23/2012 277  150 - 400 K/uL Final  . Squamous Epithelial / LPF 10/23/2012 FEW* RARE Final  . WBC, UA 10/23/2012 0-2  <3 WBC/hpf Final  . RBC / HPF 10/23/2012 0-2  <3 RBC/hpf Final  . Bacteria, UA 10/23/2012 FEW* RARE Final     X-Rays:Dg Chest 2 View  10/23/2012  *RADIOLOGY REPORT*  Clinical Data: Preop  CHEST - 2 VIEW  Comparison: None.  Findings: Epicardial fat in the right pericardiophrenic angle is noted.  Allowing for this, the heart is normal in size and no mediastinal masses are present.  No pneumothorax and no pleural effusion. Clear lungs.  IMPRESSION: No active cardiopulmonary disease.   Original Report Authenticated By: Jolaine Click, M.D.    Dg Lumbar Spine 2-3 Views  10/23/2012  *RADIOLOGY REPORT*  Clinical Data: Preop  LUMBAR SPINE - 2-3 VIEW  Comparison: 09/21/2011  Findings: Dextroscoliosis at L2-3 is stable.  Retrolisthesis L3 upon L4 as well as L4 upon L5 is stable.  Severe disc narrowing at L2-3 and L3-4.  Moderate narrowing at the other levels.  No vertebral compression deformity.  IMPRESSION: Stable degenerative changes.   Original Report Authenticated By: Jolaine Click, M.D.    Dg Spine Portable 1 View  10/26/2012  *RADIOLOGY REPORT*  Clinical Data: Back pain  PORTABLE SPINE - 1 VIEW  Comparison: Multiple priors  Findings: Intraoperative lateral film at  9:10 hours.  Slightly angled probe more superiorly located is directed most closely toward the inferior aspect of the L4 vertebral body.  45 degrees angled probe more inferiorly located is directed most closely toward the pedicle of L5.  IMPRESSION: As above.   Original Report Authenticated By: Davonna Belling, M.D.    Dg Spine Portable 1 View  10/26/2012  *RADIOLOGY REPORT*  Clinical Data: Intraoperative localization for L4-5 surgery.  PORTABLE SPINE - 1 VIEW  Comparison: 10/26/2012 at 8:12 a.m.  Findings: Tissue spreaders are again noted at L4-5 with a blunt tip probe oriented towards the L4-5 intervertebral level, and a flat tip probe oriented towards the inferior endplate of L4.  Linear radiodensity noted projecting posterior to the L3-4 intervertebral level  IMPRESSION: 1.  Curved tip probe oriented towards L4-5, with the flat type probe oriented towards the inferior endplate of L4. 2.  Linear radiodensity projecting over the L3-4 facet region, probably incidental.   Original Report Authenticated By: Gaylyn Rong, M.D.    Dg Spine Portable 1 View  10/26/2012  *RADIOLOGY REPORT*  Clinical Data: Intraoperative localization, image #2, surgery at L4- 5.  PORTABLE SPINE - 1 VIEW  Comparison: 10/26/2012 at 7:54 a.m.  Findings: Tissue spreaders are in place at the L4-5 level, with a blunt curve tip probe oriented towards the inferior margin of the L4 vertebral body, near the L4-5 level.  Material device above the level of the tissue spreaders is oriented towards L3-4.  Lumbar spondylosis noted with mild retrolisthesis at L3-4.  IMPRESSION: 1.  Curve tip probe is oriented towards the L4-5 intervertebral level.   Original Report Authenticated By: Gaylyn Rong, M.D.    Dg Spine Portable 1 View  10/26/2012  *RADIOLOGY REPORT*  Clinical  Data: Lumbar spine surgery, L4-5.  PORTABLE SPINE - 1 VIEW  Comparison: 10/23/2012  Findings: Lateral intraoperative view lumbar spine demonstrates two needle type probes.   The inferior probe is oriented towards the L5- S1 intervertebral space.  The superior probe is oriented towards the top of the L5 vertebral body.  Spinous processes are poorly defined at L4 and L5.  IMPRESSION:  1.  Lower probe is oriented towards L5-S1; upper probe is oriented towards the top of the L5 vertebral body.   Original Report Authenticated By: Gaylyn Rong, M.D.     EKG: Orders placed during the hospital encounter of 10/26/12  . EKG     Hospital Course: Shelby Shelby [name] is a 51 y.o. who was admitted to [facility]. They were brought to the operating room on 10/26/2012 and underwent Procedure(s): LUMBAR LAMINECTOMY/DECOMPRESSION MICRODISCECTOMY.  Patient tolerated the procedure well and was later transferred to the recovery room and then to the orthopaedic floor for postoperative care.  They were given PO and IV analgesics for pain control following their surgery.  They were given 24 hours of postoperative antibiotics of  Anti-infectives     Start     Dose/Rate Route Frequency Ordered Stop   10/26/12 1400   ceFAZolin (ANCEF) IVPB 1 g/50 mL premix        1 g 100 mL/hr over 30 Minutes Intravenous 3 times per day 10/26/12 1221 10/27/12 0615   10/26/12 0758   polymyxin B 500,000 Units, bacitracin 50,000 Units in sodium chloride irrigation 0.9 % 500 mL irrigation  Status:  Discontinued          As needed 10/26/12 0759 10/26/12 0937   10/26/12 0548   ceFAZolin (ANCEF) IVPB 2 g/50 mL premix        2 g 100 mL/hr over 30 Minutes Intravenous 60 min pre-op 10/26/12 0548 10/26/12 0740        PT ordered for gait training and assistance.  Discharge planning consulted to help with postop disposition and equipment needs.  Patient had a good night on the evening of surgery and started to get up OOB with therapy on day one. She had some back soreness but no leg pain. Patient was voiding well on her own. Patient was seen in rounds and was ready to go home.   Discharge  Medications: Prior to Admission medications   Medication Sig Start Date End Date Taking? Authorizing Provider  ibuprofen (ADVIL,MOTRIN) 200 MG tablet Take 200 mg by mouth every 6 (six) hours as needed.   Yes Historical Provider, MD  Multiple Vitamins-Minerals (MULTIVITAMIN WITH MINERALS) tablet Take 1 tablet by mouth daily.   Yes Historical Provider, MD  Nutritional Supplements (ESTROVEN PO) Take 1 tablet by mouth every morning.   Yes Historical Provider, MD  methocarbamol (ROBAXIN) 500 MG tablet Take 1 tablet (500 mg total) by mouth every 6 (six) hours as needed. 10/27/12   Shelby Bledsoe Tamala Ser, PA  oxyCODONE-acetaminophen (PERCOCET/ROXICET) 5-325 MG per tablet Take 1-2 tablets by mouth every 4 (four) hours as needed. 10/27/12   Shelby Cantave Tamala Ser, PA    Diet: Regular diet Activity:WBAT Follow-up:in 2 weeks Disposition - Home Discharged Condition: good   Discharge Orders    Future Orders Please Complete By Expires   Call MD / Call 911      Comments:   If you experience chest pain or shortness of breath, CALL 911 and be transported to the hospital emergency room.  If you develope a fever above 101 F,  pus (white drainage) or increased drainage or redness at the wound, or calf pain, call your surgeon's office.   Constipation Prevention      Comments:   Drink plenty of fluids.  Prune juice may be helpful.  You may use a stool softener, such as Colace (over the counter) 100 mg twice a day.  Use MiraLax (over the counter) for constipation as needed.   Increase activity slowly as tolerated      Discharge instructions      Comments:   Change your dressing daily. Shower only, no tub bath. Call if any temperatures greater than 101 or any wound complications: [phone] during the day and ask for Dr. Jeannetta Ellis nurse, Mackey Birchwood. Follow up in 2 weeks   Driving restrictions      Comments:   No driving for 2 weeks   Lifting restrictions      Comments:   No lifting       Medication  List     As of 10/30/2012 10:38 AM    TAKE these medications         ESTROVEN PO   Take 1 tablet by mouth every morning.      ibuprofen 200 MG tablet   Commonly known as: ADVIL,MOTRIN   Take 200 mg by mouth every 6 (six) hours as needed.      methocarbamol 500 MG tablet   Commonly known as: ROBAXIN   Take 1 tablet (500 mg total) by mouth every 6 (six) hours as needed.      multivitamin with minerals tablet   Take 1 tablet by mouth daily.      oxyCODONE-acetaminophen 5-325 MG per tablet   Commonly known as: PERCOCET/ROXICET   Take 1-2 tablets by mouth every 4 (four) hours as needed.         SignedCeledonio Savage, Daelan Gatt LAUREN 10/30/2012, 10:38 AM

## 2013-07-05 ENCOUNTER — Other Ambulatory Visit: Payer: Self-pay | Admitting: Obstetrics & Gynecology

## 2013-07-05 DIAGNOSIS — R928 Other abnormal and inconclusive findings on diagnostic imaging of breast: Secondary | ICD-10-CM

## 2013-07-20 ENCOUNTER — Ambulatory Visit
Admission: RE | Admit: 2013-07-20 | Discharge: 2013-07-20 | Disposition: A | Discharge status: Home / Self Care | Payer: BC Managed Care – PPO | Source: Ambulatory Visit | Attending: Obstetrics & Gynecology | Admitting: Obstetrics & Gynecology

## 2013-07-20 DIAGNOSIS — R928 Other abnormal and inconclusive findings on diagnostic imaging of breast: Secondary | ICD-10-CM

## 2014-09-30 ENCOUNTER — Other Ambulatory Visit: Payer: Self-pay | Admitting: Obstetrics & Gynecology

## 2014-09-30 DIAGNOSIS — N63 Unspecified lump in unspecified breast: Secondary | ICD-10-CM

## 2014-10-14 ENCOUNTER — Ambulatory Visit
Admission: RE | Admit: 2014-10-14 | Discharge: 2014-10-14 | Disposition: A | Discharge status: Home / Self Care | Payer: BC Managed Care – PPO | Source: Ambulatory Visit | Attending: Obstetrics & Gynecology | Admitting: Obstetrics & Gynecology

## 2014-10-14 ENCOUNTER — Other Ambulatory Visit: Payer: Self-pay | Admitting: Obstetrics & Gynecology

## 2014-10-14 ENCOUNTER — Encounter (INDEPENDENT_AMBULATORY_CARE_PROVIDER_SITE_OTHER): Payer: Self-pay

## 2014-10-14 DIAGNOSIS — N63 Unspecified lump in unspecified breast: Secondary | ICD-10-CM

## 2014-10-23 ENCOUNTER — Other Ambulatory Visit: Payer: Self-pay | Admitting: Obstetrics & Gynecology

## 2014-10-23 ENCOUNTER — Ambulatory Visit
Admission: RE | Admit: 2014-10-23 | Discharge: 2014-10-23 | Disposition: A | Discharge status: Home / Self Care | Payer: BC Managed Care – PPO | Source: Ambulatory Visit | Attending: Obstetrics & Gynecology | Admitting: Obstetrics & Gynecology

## 2014-10-23 DIAGNOSIS — N63 Unspecified lump in unspecified breast: Secondary | ICD-10-CM

## 2015-10-01 ENCOUNTER — Other Ambulatory Visit: Payer: Self-pay

## 2015-10-01 ENCOUNTER — Other Ambulatory Visit: Payer: Self-pay | Admitting: Obstetrics & Gynecology

## 2015-10-01 DIAGNOSIS — N6310 Unspecified lump in the right breast, unspecified quadrant: Secondary | ICD-10-CM

## 2015-10-01 DIAGNOSIS — Z1231 Encounter for screening mammogram for malignant neoplasm of breast: Secondary | ICD-10-CM

## 2015-10-16 ENCOUNTER — Ambulatory Visit
Admission: RE | Admit: 2015-10-16 | Discharge: 2015-10-16 | Disposition: A | Discharge status: Home / Self Care | Payer: BC Managed Care – PPO | Source: Ambulatory Visit | Attending: Obstetrics & Gynecology | Admitting: Obstetrics & Gynecology

## 2015-10-16 DIAGNOSIS — N6310 Unspecified lump in the right breast, unspecified quadrant: Secondary | ICD-10-CM

## 2016-12-21 ENCOUNTER — Other Ambulatory Visit: Payer: Self-pay | Admitting: Obstetrics & Gynecology

## 2016-12-21 DIAGNOSIS — R928 Other abnormal and inconclusive findings on diagnostic imaging of breast: Secondary | ICD-10-CM

## 2016-12-24 ENCOUNTER — Ambulatory Visit
Admission: RE | Admit: 2016-12-24 | Discharge: 2016-12-24 | Disposition: A | Discharge status: Home / Self Care | Payer: BC Managed Care – PPO | Source: Ambulatory Visit | Attending: Obstetrics & Gynecology | Admitting: Obstetrics & Gynecology

## 2016-12-24 DIAGNOSIS — R928 Other abnormal and inconclusive findings on diagnostic imaging of breast: Secondary | ICD-10-CM

## 2017-03-09 ENCOUNTER — Emergency Department (HOSPITAL_COMMUNITY)
Admission: EM | Admit: 2017-03-09 | Discharge: 2017-03-09 | Disposition: A | Discharge status: Home / Self Care | Payer: BC Managed Care – PPO | Attending: Emergency Medicine | Admitting: Emergency Medicine

## 2017-03-09 ENCOUNTER — Encounter (HOSPITAL_COMMUNITY): Payer: Self-pay | Admitting: Emergency Medicine

## 2017-03-09 ENCOUNTER — Emergency Department (HOSPITAL_COMMUNITY): Payer: BC Managed Care – PPO

## 2017-03-09 DIAGNOSIS — R109 Unspecified abdominal pain: Secondary | ICD-10-CM | POA: Diagnosis present

## 2017-03-09 LAB — COMPREHENSIVE METABOLIC PANEL
ALT: 26 U/L (ref 14–54)
AST: 28 U/L (ref 15–41)
Albumin: 4.6 g/dL (ref 3.5–5.0)
Alkaline Phosphatase: 47 U/L (ref 38–126)
Anion gap: 8 (ref 5–15)
BUN: 16 mg/dL (ref 6–20)
CO2: 27 mmol/L (ref 22–32)
Calcium: 9.6 mg/dL (ref 8.9–10.3)
Chloride: 104 mmol/L (ref 101–111)
Creatinine, Ser: 0.73 mg/dL (ref 0.44–1.00)
Glucose, Bld: 118 mg/dL — ABNORMAL HIGH (ref 65–99)
POTASSIUM: 3.9 mmol/L (ref 3.5–5.1)
SODIUM: 139 mmol/L (ref 135–145)
Total Bilirubin: 0.5 mg/dL (ref 0.3–1.2)
Total Protein: 7.9 g/dL (ref 6.5–8.1)

## 2017-03-09 LAB — URINALYSIS, ROUTINE W REFLEX MICROSCOPIC
BILIRUBIN URINE: NEGATIVE
Glucose, UA: NEGATIVE mg/dL
Ketones, ur: 5 mg/dL — AB
Leukocytes, UA: NEGATIVE
Nitrite: NEGATIVE
PROTEIN: 30 mg/dL — AB
Specific Gravity, Urine: 1.024 (ref 1.005–1.030)
pH: 5 (ref 5.0–8.0)

## 2017-03-09 LAB — CBC
HEMATOCRIT: 44.7 % (ref 36.0–46.0)
Hemoglobin: 15.2 g/dL — ABNORMAL HIGH (ref 12.0–15.0)
MCH: 31 pg (ref 26.0–34.0)
MCHC: 34 g/dL (ref 30.0–36.0)
MCV: 91 fL (ref 78.0–100.0)
Platelets: 256 10*3/uL (ref 150–400)
RBC: 4.91 MIL/uL (ref 3.87–5.11)
RDW: 13.1 % (ref 11.5–15.5)
WBC: 11.8 10*3/uL — AB (ref 4.0–10.5)

## 2017-03-09 LAB — LIPASE, BLOOD: LIPASE: 21 U/L (ref 11–51)

## 2017-03-09 MED ORDER — KETOROLAC TROMETHAMINE 30 MG/ML IJ SOLN
30.0000 mg | Freq: Once | INTRAMUSCULAR | Status: AC
  Administered 2017-03-09: 30 mg via INTRAVENOUS
  Filled 2017-03-09: qty 1

## 2017-03-09 MED ORDER — SODIUM CHLORIDE 0.9 % IV BOLUS (SEPSIS)
1000.0000 mL | Freq: Once | INTRAVENOUS | Status: AC
  Administered 2017-03-09: 1000 mL via INTRAVENOUS

## 2017-03-09 NOTE — ED Triage Notes (Signed)
Patient reports right flank pain starting this morning. Reports nausea. Denies diarrhea or emesis.

## 2017-03-09 NOTE — Discharge Instructions (Signed)
As discussed, no obstructing kidney stones were found on CT scan. Although it is possible that you have passed a small stone before the scan was performed.  Drink plenty of fluids to keep your urine clear. Follow up with your primary care provider for reevaluation of hematuria(blood cells in your urine).  Return to the emergency department if the pain returns, you have a fever/chills, nausea, vomiting or any new concerning symptoms.

## 2017-03-09 NOTE — ED Provider Notes (Signed)
[facility]-EMERGENCY DEPT Provider Note   CSN: [id number] Arrival date & time: 03/09/17  1125     History   Chief Complaint Chief Complaint  Patient presents with  . Flank Pain    Right    HPI Shelby [name] is a 56 y.o. female presenting with 1 day of right flank pain that starts from the back radiating to her right flank. She reports intermittent episodes of intense sharp pain. At this time she still feels achiness but it is bearable. She had an episode of heat wave and nausea earlier today denies any nausea at this time. Denies fever, chills, vomiting, diarrhea, or gross hematuria, dysuria except for one episode of difficulty urinating when she first got here, but subsequently urinated as normal. She does have a remote history of kidney stone about 30 years ago.   HPI  History reviewed. No pertinent past medical history.  Patient Active Problem List   Diagnosis Date Noted  . Neurogenic claudication due to lumbar spinal stenosis 10/26/2012    Past Surgical History:  Procedure Laterality Date  . BACK SURGERY  06/2010  . LUMBAR LAMINECTOMY/DECOMPRESSION MICRODISCECTOMY  10/26/2012   Procedure: LUMBAR LAMINECTOMY/DECOMPRESSION MICRODISCECTOMY;  Surgeon: Jacki Cones, MD;  Location: [facility] ORS;  Service: Orthopedics;  Laterality: Right;  Hemi Laminectomy, Microdiscectomy  L4-L5 on Right. Decompression for spinal stenosis L3-L4, decompression for recurrent L4-L5, Foramenectomy L3 root, L4 root, L5 root    OB History    No data available       Home Medications    Prior to Admission medications   Medication Sig Start Date End Date Taking? Authorizing Provider  fluticasone (FLONASE) 50 MCG/ACT nasal spray Place 1 spray into both nostrils daily.   Yes Historical Provider, MD  ibuprofen (ADVIL,MOTRIN) 200 MG tablet Take 400 mg by mouth every 6 (six) hours as needed for headache.    Yes Historical Provider, MD  Multiple Vitamins-Minerals (MULTIVITAMIN WITH MINERALS) tablet Take 1 tablet  by mouth daily.   Yes Historical Provider, MD  sertraline (ZOLOFT) 25 MG tablet Take 25 mg by mouth daily.   Yes Historical Provider, MD  methocarbamol (ROBAXIN) 500 MG tablet Take 1 tablet (500 mg total) by mouth every 6 (six) hours as needed. Patient not taking: Reported on 03/09/2017 10/27/12   Dimitri Ped, PA-C  oxyCODONE-acetaminophen (PERCOCET/ROXICET) 5-325 MG per tablet Take 1-2 tablets by mouth every 4 (four) hours as needed. Patient not taking: Reported on 03/09/2017 10/27/12   Dimitri Ped, PA-C    Family History No family history on file.  Social History Social History  Substance Use Topics  . Smoking status: Never Smoker  . Smokeless tobacco: Never Used  . Alcohol use Yes     Comment: occassionally     Allergies   Sulfa antibiotics   Review of Systems Review of Systems  Constitutional: Negative for chills and fever.  Eyes: Negative for pain and visual disturbance.  Respiratory: Negative for cough, choking, chest tightness, shortness of breath, wheezing and stridor.   Cardiovascular: Negative for chest pain, palpitations and leg swelling.  Gastrointestinal: Negative for abdominal distention, abdominal pain, anal bleeding, blood in stool, diarrhea, nausea and vomiting.  Genitourinary: Positive for difficulty urinating and flank pain. Negative for dysuria, frequency, hematuria and pelvic pain.  Musculoskeletal: Negative for arthralgias, back pain, gait problem, joint swelling, myalgias, neck pain and neck stiffness.  Skin: Negative for color change, pallor and rash.  Neurological: Negative for dizziness, seizures, syncope, weakness, light-headedness and headaches.  Physical Exam Updated Vital Signs BP 133/78 (BP Location: Right Arm)   Pulse 88   Temp 97.9 F (36.6 C) (Oral)   Resp 18   Ht 5\' 4"  (1.626 m)   Wt 72.6 kg   SpO2 98%   BMI 27.46 kg/m   Physical Exam  Constitutional: She appears well-developed and well-nourished. No distress.  Patient  is afebrile, nontoxic-appearing, sitting comfortably in bed in no acute distress. She reports mild discomfort at this time but does not want anything strong for pain.  HENT:  Head: Normocephalic and atraumatic.  Eyes: Conjunctivae and EOM are normal. No scleral icterus.  Neck: Normal range of motion. Neck supple.  Cardiovascular: Normal rate, regular rhythm, normal heart sounds and intact distal pulses.   No murmur heard. Pulmonary/Chest: Effort normal and breath sounds normal. No respiratory distress. She has no rales. She exhibits no tenderness.  Abdominal: Soft. Bowel sounds are normal. She exhibits no distension and no mass. There is no tenderness. There is no rebound and no guarding.  No tenderness palpation of her flank, no CVA tenderness, negative Murphy sign, negative McBurney's point tenderness, negative rebound. No pulsating mass or tenderness to palpation of the entire abdomen.  Musculoskeletal: She exhibits no edema.  Neurological: She is alert.  Skin: Skin is warm and dry. She is not diaphoretic.  Psychiatric: She has a normal mood and affect.  Nursing note and vitals reviewed.    ED Treatments / Results  Labs (all labs ordered are listed, but only abnormal results are displayed) Labs Reviewed  URINALYSIS, ROUTINE W REFLEX MICROSCOPIC - Abnormal; Notable for the following:       Result Value   APPearance HAZY (*)    Hgb urine dipstick MODERATE (*)    Ketones, ur 5 (*)    Protein, ur 30 (*)    Bacteria, UA RARE (*)    Squamous Epithelial / LPF 0-5 (*)    All other components within normal limits  COMPREHENSIVE METABOLIC PANEL - Abnormal; Notable for the following:    Glucose, Bld 118 (*)    All other components within normal limits  CBC - Abnormal; Notable for the following:    WBC 11.8 (*)    Hemoglobin 15.2 (*)    All other components within normal limits  LIPASE, BLOOD    EKG  EKG Interpretation None       Radiology Ct Renal Stone Study  Result Date:  03/09/2017 CLINICAL DATA:  Right flank pain today, some nausea EXAM: CT ABDOMEN AND PELVIS WITHOUT CONTRAST TECHNIQUE: Multidetector CT imaging of the abdomen and pelvis was performed following the standard protocol without IV contrast. COMPARISON:  None. FINDINGS: Lower chest: The lung bases are clear. The heart is within normal limits in size. Hepatobiliary: The liver is unremarkable in the unenhanced state. A tiny calcified gallstone cannot be excluded layering in the neck of the gallbladder. Pancreas: The pancreas is normal in size and the pancreatic duct is not dilated. Spleen: The spleen is unremarkable. Adrenals/Urinary Tract: The adrenal glands appear symmetrical and normal. Small bilateral renal calculi are present, the larger in the lower pole of the right kidney of no more than 3 mm in diameter. The ureters appear normal in caliber. The urinary bladder is not well distended but no calculi are noted. Stomach/Bowel: The stomach is not well distended. No small bowel distention is seen. There are multiple rectosigmoid colon diverticula present. Diverticula also are scattered throughout the entire colon. The terminal ileum is unremarkable. The appendix  is normal in caliber. Vascular/Lymphatic: The abdominal aorta is normal in caliber is well with only mild abdominal aortic atherosclerosis present. No adenopathy is seen. Reproductive: The uterus is normal in size. A few small calcifications in the myometrium may represent small calcified uterine fibroids. No ovarian lesion is seen and there is no evidence of free fluid within the pelvis. Other: None. Musculoskeletal: There is retrolisthesis of L3 on L4, L4 on L5 and L5 on S1 most likely degenerative in origin. Diffuse degenerative disc disease is present throughout the lumbar spine. IMPRESSION: 1. There are small nonobstructing renal calculi bilaterally but no present evidence of hydronephrosis is seen. 2. Questionable tiny gallstone layering in the neck of the  gallbladder. 3. Diffuse colonic diverticula. 4. Mild abdominal aortic atherosclerosis. 5. Diffuse degenerative change in the lumbar spine as noted above. Electronically Signed   By: Dwyane DeePaul  Barry M.D.   On: 03/09/2017 15:32    Procedures Procedures (including critical care time)  Medications Ordered in ED Medications  sodium chloride 0.9 % bolus 1,000 mL (0 mLs Intravenous Stopped 03/09/17 1457)  ketorolac (TORADOL) 30 MG/ML injection 30 mg (30 mg Intravenous Given 03/09/17 1419)  sodium chloride 0.9 % bolus 1,000 mL (0 mLs Intravenous Stopped 03/09/17 1619)     Initial Impression / Assessment and Plan / ED Course  I have reviewed the triage vital signs and the nursing notes.  Pertinent labs & imaging results that were available during my care of the patient were reviewed by me and considered in my medical decision making (see chart for details).     Afebrile, non-toxic appearing. Symptoms are consistent with a right-sided nephrolithiasis. Ordered CT renal and started IV fluids. No CVA tenderness.  She did not want anything strong for pain and we discussed the need for creatinine result before Toradol and she agreed to wait until her CMP. she is not in severe pain at this time.  Patient reports no pain and significant improvement after toradol and iv fluids.  Ct without obstructive stone or hydronephrosis, but stone seen in kidney and very small stone at the neck of the gallbladder.  Patient has hematuria and may have passed the stone prior to CT. She had no abdominal pain or tenderness to palpation. Negative murphy's sign. Episode were not necessarily postprandial. Labs unremarkable  Patient was comfortable and ready to go home. She is stable, well-appearing, no pain, afebrile and non-toxic. Will discharge home with [place] follow up for cholelithiasis and PCP follow up for repeat urine evaluating for hematuria.  Discussed strict return precautions and advised to return to the  emergency department if experiencing any new or worsening symptoms. Instructions were understood and patient agreed with discharge plan.  Final Clinical Impressions(s) / ED Diagnoses   Final diagnoses:  Right flank pain    New Prescriptions Discharge Medication List as of 03/09/2017  4:17 PM       Georgiana ShoreJessica B Olie Scaffidi, PA-C 03/09/17 1630    Alvira MondayErin Schlossman, MD 03/12/17 [phone]

## 2018-09-13 IMAGING — CT CT RENAL STONE PROTOCOL
2 of 3 series · 16 of 46 positions shown, 18 images · non-contrast
Comparison: None.

CLINICAL DATA: Right flank pain today, some nausea

EXAM:
CT ABDOMEN AND PELVIS WITHOUT CONTRAST
TECHNIQUE: Multidetector CT imaging of the abdomen and pelvis was performed
following the standard protocol without IV contrast.

[Series 4: lung · axial · 0.74mm/px · z∈[+1509,+1591]mm · 13 of 47 slices shown, 15 images]
[im 3/47  soft-tissue]
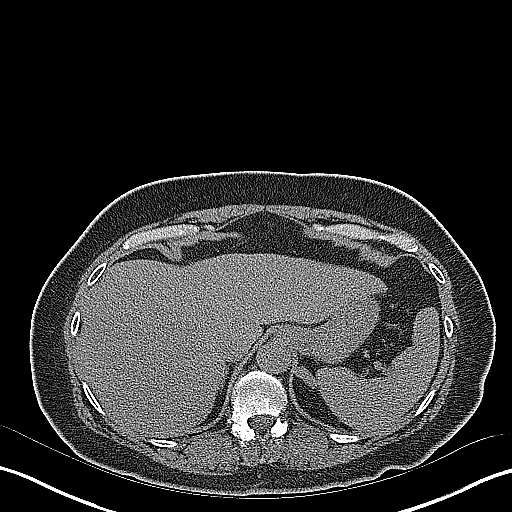
[im 3/47  bone]
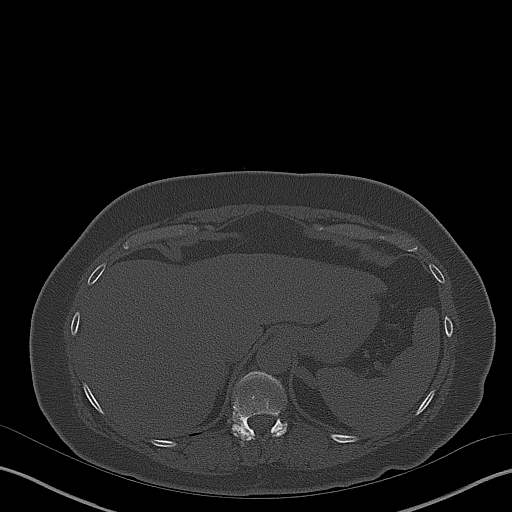
[im 6/47  soft-tissue]
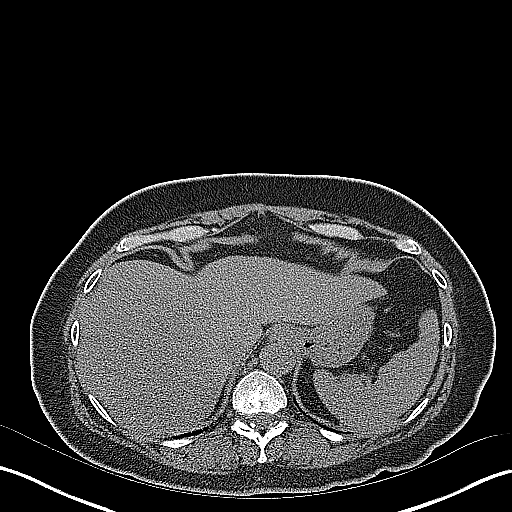
[im 9/47  soft-tissue]
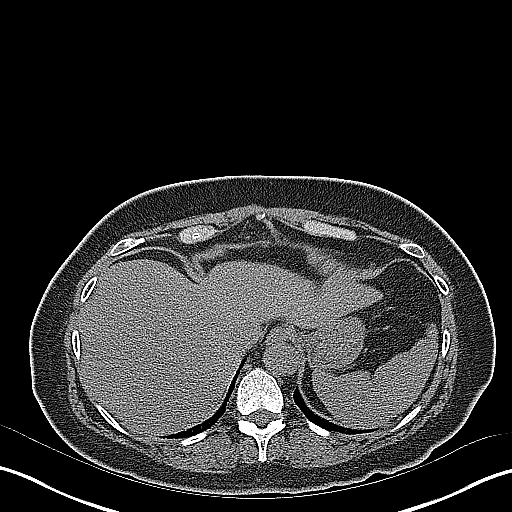
[im 14/47  soft-tissue]
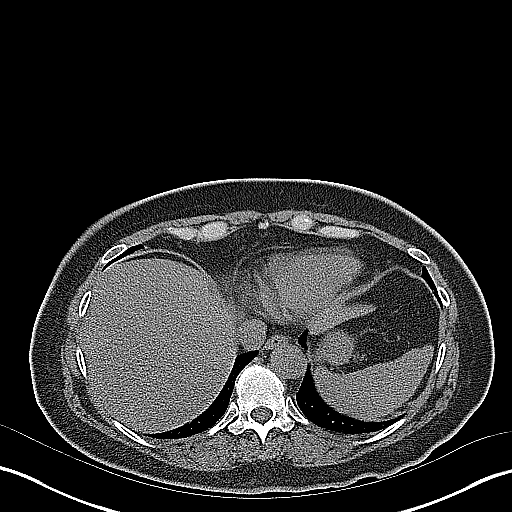
[im 17/47  soft-tissue]
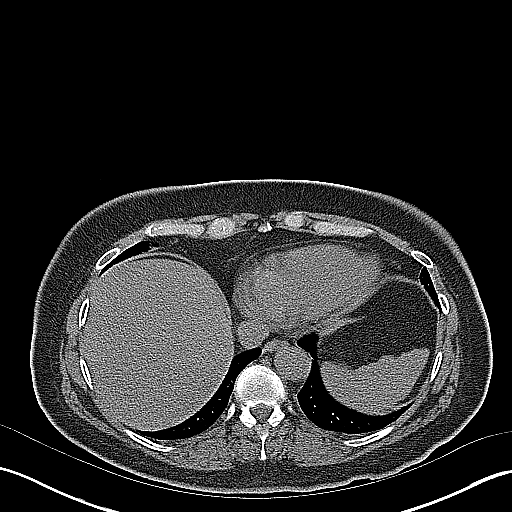
[im 20/47  soft-tissue]
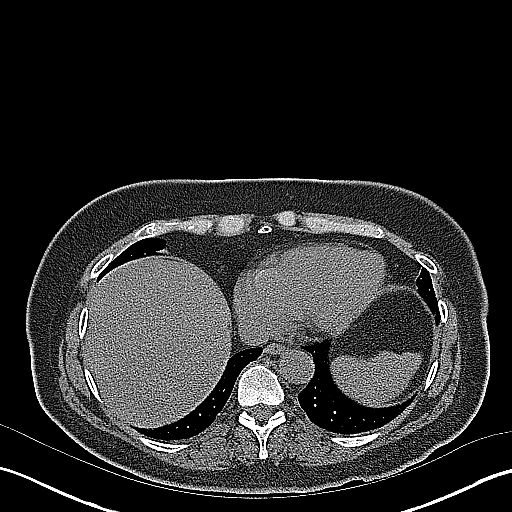
[im 24/47  soft-tissue]
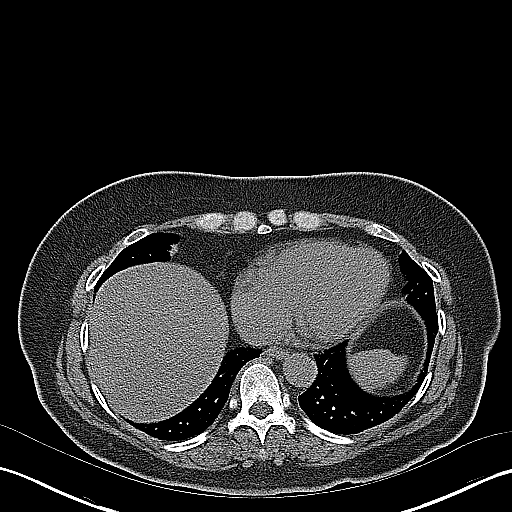
[im 27/47  soft-tissue]
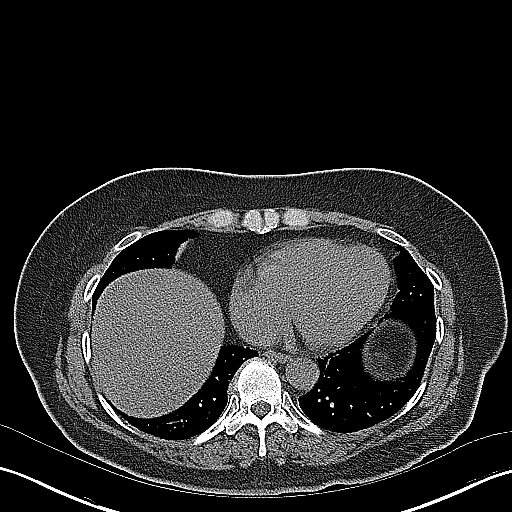
[im 30/47  soft-tissue]
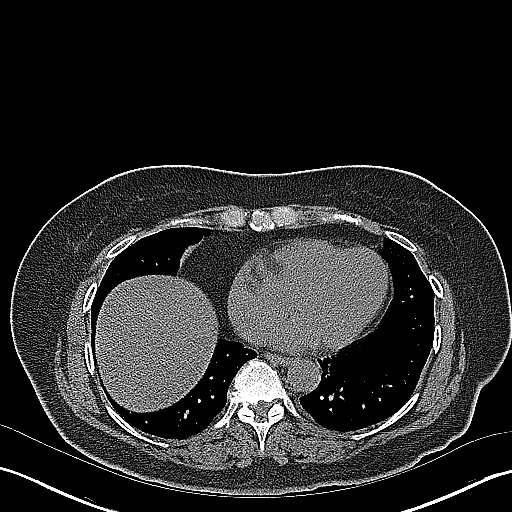
[im 30/47  bone]
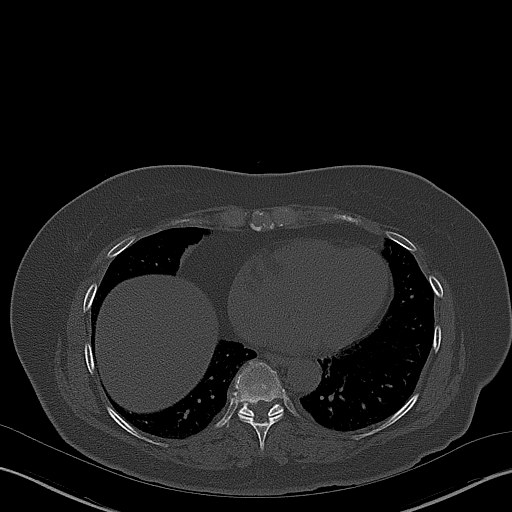
[im 33/47  soft-tissue]
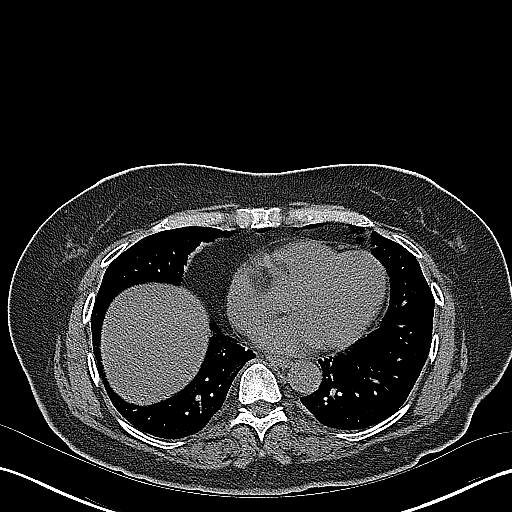
[im 38/47  soft-tissue]
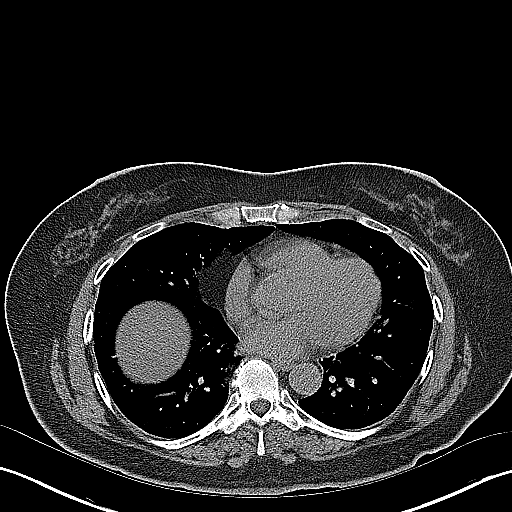
[im 41/47  soft-tissue]
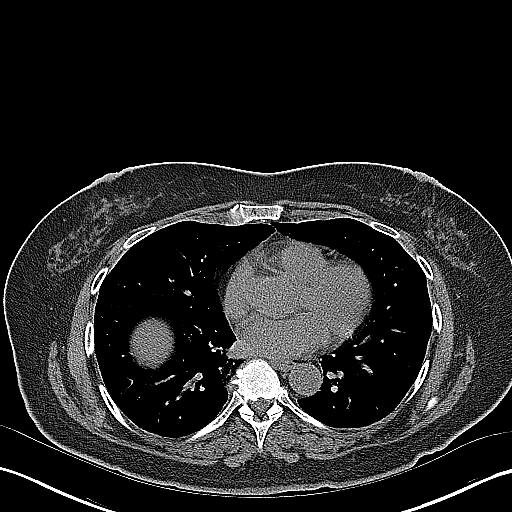
[im 44/47  soft-tissue]
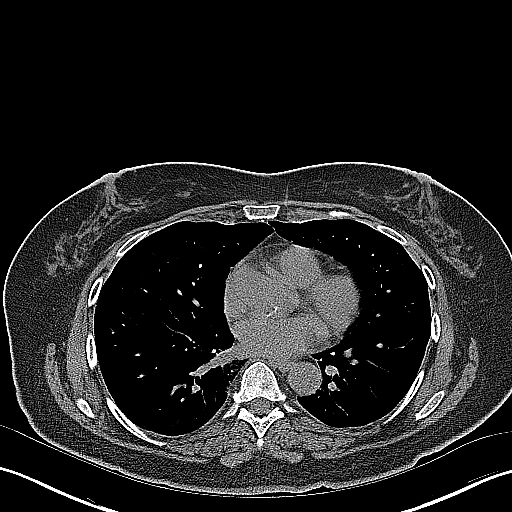

[Series 5: coronal · coronal · 0.79mm/px · 3 of 167 slices shown]
[im 56/167  soft-tissue]
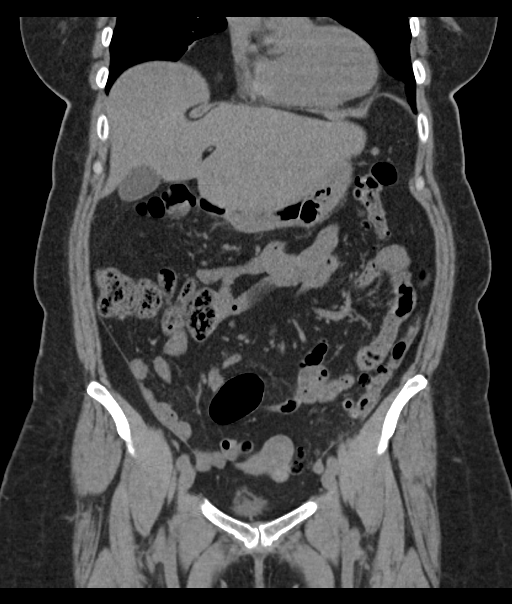
[im 74/167  soft-tissue]
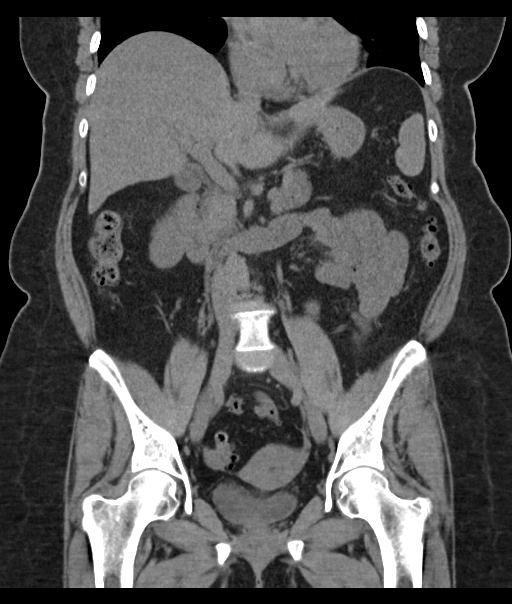
[im 93/167  soft-tissue]
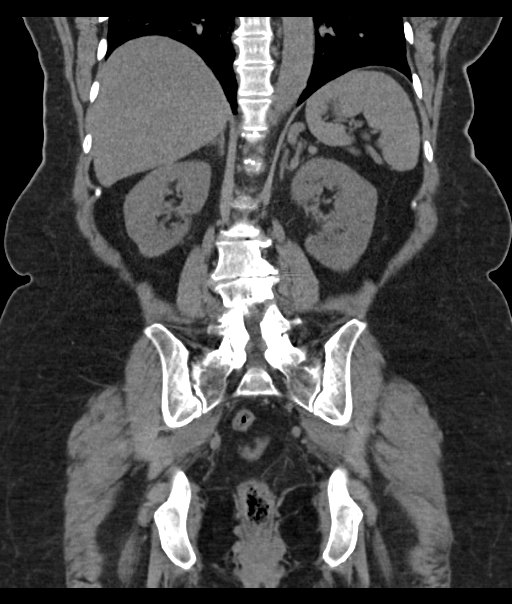

[16 of 46 positions shown; findings below may reference images not displayed]

FINDINGS: Lower chest: The lung bases are clear. The heart is within normal
limits in size.

Hepatobiliary: The liver is unremarkable in the unenhanced state. A
tiny calcified gallstone cannot be excluded layering in the neck of
the gallbladder.

Pancreas: The pancreas is normal in size and the pancreatic duct is
not dilated.

Spleen: The spleen is unremarkable.

Adrenals/Urinary Tract: The adrenal glands appear symmetrical and
normal. Small bilateral renal calculi are present, the larger in the
lower pole of the right kidney of no more than 3 mm in diameter. The
ureters appear normal in caliber. The urinary bladder is not well
distended but no calculi are noted.

Stomach/Bowel: The stomach is not well distended. No small bowel
distention is seen. There are multiple rectosigmoid colon
diverticula present. Diverticula also are scattered throughout the
entire colon. The terminal ileum is unremarkable. The appendix is
normal in caliber.

Vascular/Lymphatic: The abdominal aorta is normal in caliber is well
with only mild abdominal aortic atherosclerosis present. No
adenopathy is seen.

Reproductive: The uterus is normal in size. A few small
calcifications in the myometrium may represent small calcified
uterine fibroids. No ovarian lesion is seen and there is no evidence
of free fluid within the pelvis.

Other: None.

Musculoskeletal: There is retrolisthesis of L3 on L4, L4 on L5 and
L5 on S1 most likely degenerative in origin. Diffuse degenerative
disc disease is present throughout the lumbar spine.
IMPRESSION: 1. There are small nonobstructing renal calculi bilaterally but no
present evidence of hydronephrosis is seen.
2. Questionable tiny gallstone layering in the neck of the
gallbladder.
3. Diffuse colonic diverticula.
4. Mild abdominal aortic atherosclerosis.
5. Diffuse degenerative change in the lumbar spine as noted above.
